# Patient Record
Sex: Female | Born: 1950 | Race: White | Hispanic: No | Smoking: Never smoker
Health system: Southern US, Community
[De-identification: ages and names within clinical notes are randomized; demographics above are authoritative.]

---

## 2007-01-31 LAB — CONVERTED CEMR LAB

## 2008-01-09 ENCOUNTER — Ambulatory Visit: Payer: Self-pay | Admitting: Family Medicine

## 2008-01-09 DIAGNOSIS — R5383 Other fatigue: Secondary | ICD-10-CM

## 2008-01-09 DIAGNOSIS — R5381 Other malaise: Secondary | ICD-10-CM

## 2008-01-09 DIAGNOSIS — F330 Major depressive disorder, recurrent, mild: Secondary | ICD-10-CM | POA: Insufficient documentation

## 2008-01-09 DIAGNOSIS — F329 Major depressive disorder, single episode, unspecified: Secondary | ICD-10-CM

## 2008-01-11 ENCOUNTER — Telehealth: Payer: Self-pay | Admitting: Family Medicine

## 2008-07-12 ENCOUNTER — Ambulatory Visit: Payer: Self-pay | Admitting: Family Medicine

## 2008-07-12 DIAGNOSIS — R109 Unspecified abdominal pain: Secondary | ICD-10-CM | POA: Insufficient documentation

## 2008-07-15 LAB — CONVERTED CEMR LAB
ALT: 14 units/L (ref 0–35)
AST: 15 units/L (ref 0–37)
Albumin: 4.5 g/dL (ref 3.5–5.2)
Alkaline Phosphatase: 59 units/L (ref 39–117)
CO2: 26 meq/L (ref 19–32)
Calcium: 9.6 mg/dL (ref 8.4–10.5)
Cholesterol: 205 mg/dL — ABNORMAL HIGH (ref 0–200)
Creatinine, Ser: 1.05 mg/dL (ref 0.40–1.20)
HCT: 43 % (ref 36.0–46.0)
Hemoglobin: 14.8 g/dL (ref 12.0–15.0)
MCV: 90.7 fL (ref 78.0–100.0)
Platelets: 267 10*3/uL (ref 150–400)
Potassium: 4.8 meq/L (ref 3.5–5.3)
RDW: 12.6 % (ref 11.5–15.5)
VLDL: 9 mg/dL (ref 0–40)

## 2008-08-02 ENCOUNTER — Ambulatory Visit: Payer: Self-pay | Admitting: Family Medicine

## 2008-08-02 DIAGNOSIS — R030 Elevated blood-pressure reading, without diagnosis of hypertension: Secondary | ICD-10-CM | POA: Insufficient documentation

## 2008-08-08 ENCOUNTER — Telehealth: Payer: Self-pay | Admitting: Family Medicine

## 2008-08-21 ENCOUNTER — Encounter: Payer: Self-pay | Admitting: Family Medicine

## 2008-09-17 ENCOUNTER — Encounter: Payer: Self-pay | Admitting: Family Medicine

## 2008-09-17 ENCOUNTER — Telehealth: Payer: Self-pay | Admitting: Family Medicine

## 2008-09-20 ENCOUNTER — Telehealth: Payer: Self-pay | Admitting: Family Medicine

## 2008-09-23 ENCOUNTER — Telehealth: Payer: Self-pay | Admitting: Family Medicine

## 2008-09-23 ENCOUNTER — Encounter: Admission: RE | Admit: 2008-09-23 | Discharge: 2008-09-23 | Payer: Self-pay | Admitting: Family Medicine

## 2008-09-23 DIAGNOSIS — N1330 Unspecified hydronephrosis: Secondary | ICD-10-CM | POA: Insufficient documentation

## 2008-09-24 ENCOUNTER — Encounter: Payer: Self-pay | Admitting: Family Medicine

## 2008-09-25 ENCOUNTER — Encounter: Payer: Self-pay | Admitting: Family Medicine

## 2008-10-15 ENCOUNTER — Encounter: Payer: Self-pay | Admitting: Family Medicine

## 2008-10-22 ENCOUNTER — Ambulatory Visit (HOSPITAL_COMMUNITY): Admission: RE | Admit: 2008-10-22 | Discharge: 2008-10-22 | Payer: Self-pay | Admitting: Urology

## 2008-10-31 ENCOUNTER — Inpatient Hospital Stay (HOSPITAL_COMMUNITY): Admission: RE | Admit: 2008-10-31 | Discharge: 2008-11-02 | Payer: Self-pay | Admitting: Urology

## 2008-10-31 ENCOUNTER — Encounter (INDEPENDENT_AMBULATORY_CARE_PROVIDER_SITE_OTHER): Payer: Self-pay | Admitting: Urology

## 2008-11-22 ENCOUNTER — Encounter: Payer: Self-pay | Admitting: Family Medicine

## 2008-12-13 ENCOUNTER — Encounter: Payer: Self-pay | Admitting: Family Medicine

## 2009-03-10 ENCOUNTER — Ambulatory Visit (HOSPITAL_COMMUNITY): Admission: RE | Admit: 2009-03-10 | Discharge: 2009-03-10 | Payer: Self-pay | Admitting: Urology

## 2009-04-06 IMAGING — NM NM RENAL IMAGING FLOW W/ PHARM
2 series · 12 of 12 positions shown · non-contrast
Comparison: CT abdomen pelvis 09/23/2008

CLINICAL DATA: Left UPJ obstruction

NUCLEAR MEDICINE RENAL SCINTIANGIOGRAPHY WITH FLOW AND FUNCTION AND
PHARMACOLOGIC AUGMENTATION
TECHNIQUE: Radionuclide angiographic and sequential renal images
were obtained after intravenous injection of radiopharmaceutical.
Imaging was continued during slow intravenous injection of Lasix
approximately 20-30 minutes after the start of the examination.
Radiopharmaceutical: 15.5 mCi technetium MAG III

[Series 1: re renal qualitative · 9.51mm/px · 6 of 130 frames shown (1 of 2)]
[frame 11/130]
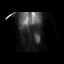
[frame 33/130]
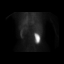
[frame 55/130]
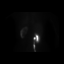
[frame 76/130]
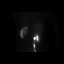
[frame 98/130]
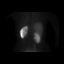
[frame 120/130]
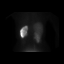

[Series 1: re renal qualitative · 9.51mm/px · 6 of 130 frames shown (2 of 2)]
[frame 11/130]
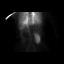
[frame 33/130]
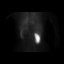
[frame 55/130]
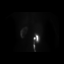
[frame 76/130]
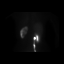
[frame 98/130]
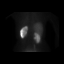
[frame 120/130]
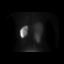

[12 of 12 positions shown; findings below may reference images not displayed]

FINDINGS: Flow:  There is marked delayed flow to the left kidney.  The right
kidney demonstrates normal perfusion.

Renogram:

Left kidney:  There is delayed cortical uptake in the left kidney
which is thinned.  There is photopenia within the left renal
collecting system suggesting hydronephrosis.  There is trace
excretion into the left renal collecting system.  No clearance of
counts from the left renal collecting system before or after Lasix
administration.

Right Kidney:  There is prompt cortical uptake within the right
kidney which is reniform shape.  There is prompt excretion of
counts into the collecting system and prompt clearance of counts
from the collecting system prior to administration of Lasix.

Differential:

Left kidney:  18%

Right Kidney:  82%

T1/2 post Lasix:

Left kidney = 787 minutes minutes
Right kidney = counts clear prior to Lasix
IMPRESSION: 1.  Left hydronephrosis and high grade obstruction at the level of
the ureteral pelvic junction.
2.  Differential renal function as above.
3.  Normal right kidney.

## 2009-04-25 ENCOUNTER — Encounter: Payer: Self-pay | Admitting: Family Medicine

## 2009-07-11 ENCOUNTER — Telehealth: Payer: Self-pay | Admitting: Family Medicine

## 2011-02-15 LAB — HEMOGLOBIN AND HEMATOCRIT, BLOOD
HCT: 32.4 % — ABNORMAL LOW (ref 36.0–46.0)
Hemoglobin: 11.3 g/dL — ABNORMAL LOW (ref 12.0–15.0)

## 2011-02-15 LAB — BASIC METABOLIC PANEL
BUN: 14 mg/dL (ref 6–23)
BUN: 9 mg/dL (ref 6–23)
CO2: 29 mEq/L (ref 19–32)
Calcium: 8.2 mg/dL — ABNORMAL LOW (ref 8.4–10.5)
Chloride: 106 mEq/L (ref 96–112)
GFR calc Af Amer: >60 mL/min (ref >60)
GFR calc non Af Amer: 55 mL/min — ABNORMAL LOW (ref >60)
Glucose, Bld: 158 mg/dL — ABNORMAL HIGH (ref 70–99)
Potassium: 4.7 mEq/L (ref 3.5–5.1)
Sodium: 137 mEq/L (ref 135–145)

## 2011-02-15 LAB — CREATININE, FLUID (PLEURAL, PERITONEAL, JP DRAINAGE): Creat, Fluid: 1.1 mg/dL

## 2011-03-16 NOTE — Op Note (Signed)
[name], [name]               ACCOUNT NO.:  [id number]   MEDICAL RECORD NO.:  [id number]          PATIENT TYPE:  INP   LOCATION:  0098                         FACILITY:  [facility]   PHYSICIAN:  Heloise Purpura, MD      DATE OF BIRTH:  January 02, 1951   DATE OF PROCEDURE:  10/31/2008  DATE OF DISCHARGE:                               OPERATIVE REPORT   PREOPERATIVE DIAGNOSIS:  Left ureteropelvic junction obstruction.   POSTOPERATIVE DIAGNOSIS:  Left ureteropelvic junction obstruction.   PROCEDURES:  1. Cystoscopy.  2. Left retrograde pyelography.  3. Left ureteral stent placement (8 x 26).  4. Left robotic-assisted laparoscopic dismembered pyeloplasty.   SURGEON:  Dr. Heloise Purpura.   ASSISTANT:  Delia Chimes, nurse practitioner.   ANESTHESIA:  General.   COMPLICATIONS:  None.   ESTIMATED BLOOD LOSS:  25 mL.   SPECIMENS:  Left ureteropelvic junction.   DISPOSITION:  Specimen to pathology.   DRAINS:  1. A 16-French Foley catheter.  2. A #15 Blake perinephric drain.   INDICATION:  [name] is a 60 year old female who presented with left-  sided flank pain.  She underwent a CT scan without contrast which  demonstrated a dilated left renal pelvis with moderate hydronephrosis  without ureteral dilation and no evidence for urolithiasis.  She  subsequently underwent further evaluation with a nuclear medicine renal  scan which indicated significant obstruction of the left kidney along  with decreased relative renal function.  After discussion regarding  management options for treatment, she did elect to proceed with the  above procedures.  She also underwent a contrasted CT scan and did not  appear to have any obvious findings suggestive of malignancy.  The  potential risks, complications, and alternative treatment options were  discussed in detail with the patient, and informed consent was obtained.   DESCRIPTION OF PROCEDURE:  The patient was taken to the operating room,  and a general anesthetic was administered.  She was given preoperative  antibiotics, placed in the dorsal lithotomy position, and prepped and  draped in the usual sterile fashion.  Next, a preoperative time out was  performed.  Cystourethroscopy was then performed which demonstrated a  normal urethra.  Inspection of the bladder revealed the ureteral  orifices to be in the normal anatomic position, and there were no  bladder tumors, stones, or other mucosal pathology that were identified.  The left ureteral orifice was identified and was attempted to be  intubated with a 6-French ureteral catheter.  It did appear to be  somewhat stenotic, and therefore, a 0.038 sensor guidewire was used to  intubate the left ureter, and the 6-French catheter was passed over the  ureter.  Contrast was injected which demonstrated a normal caliber  ureter without evidence of filling defects.  There was noted to be  significant tortuosity at the level of the ureteropelvic junction along  with hydronephrosis of the left renal pelvis.  No filling defects or  abnormalities suggestive of malignancy were identified.  At this point,  the 6-French ureteral catheter was removed after the 0.038 sensor  guidewire was advanced  back into the ureter, and this was advanced up  into the renal pelvis under fluoroscopic guidance without difficulty.  An 8 x 26-French double-J ureteral stent was then attempted to be  advanced over the wire into the ureter.  However, the distal ureter  appeared to be again stenotic and would not allow the stent to pass.  Therefore, the 4-cm balloon dilator was advanced over the wire, and the  distal left ureter was balloon dilated, again under fluoroscopic  guidance.  After the balloon had been left inflated for approximately 2  minutes, it was deflated and removed, and another attempt to place the 8  x 26 double-J ureteral stent was made.  The stent was placed without  difficulty with a good curl  noted in the renal pelvis as well as in the  bladder under fluoroscopic and cystoscopic guidance.  A 16-French Foley  catheter was then placed, and the patient was repositioned in the left  modified flank position with care to pad all potential pressure points.  Her abdomen was prepped and draped in the usual sterile fashion, and a  site was selected just superior to the umbilicus for placement of the  camera port.  This was placed using a standard open Hassan technique  which allowed entry into the peritoneal cavity under direct vision  without difficulty.  Additional 8-mm robotic ports were then placed in  the left upper quadrant, left lower quadrant, and far left lower  quadrant.  An additional 12-mm port was placed in the lower midline.  All ports were placed under direct vision without difficulty.  The  surgical cart was then docked.  With the aid of the cautery scissors,  the white line of Toldt was incised along the length of the descending  colon allowing the colon to be mobilized medially in the space between  the mesocolon and Gerota's fascia to be developed.  The ureter was  identified overlying the psoas muscle and was lifted anteriorly off the  psoas muscle, thereby allowing the posterior plane between the kidney  and the psoas muscle to be developed.  The ureter was isolated with care  to preserve the periureteral blood supply.  Dissection proceeded  superiorly, and there were noted to be crossing vessels to the lower  pole of the kidney at the level of the ureteropelvic junction.  The  renal pelvis was then isolated from the surrounding structures with a  combination of blunt and sharp dissection as necessary.  Once the renal  pelvis and ureteropelvic junction had been dissected adequately, the  ureter was divided just below the level of the ureteropelvic junction.  The patient's indwelling stent was identified and was pulled out of the  renal pelvis.  The ureter had been  partially spatulated prior to  complete dismemberment in order to mark the lateral aspect of the  ureter.  The ureteropelvic junction was then excised and sent for  permanent pathologic analysis.  The renal pelvis was then brought  anterior to the crossing vessels and examined.  The digit appeared to be  somewhat patulous, and it was opened medially.  Excess renal pelvis was  then excised off the medial aspect both anteriorly and posteriorly to  allow a nice tunnel and funnel appearance of the ureteropelvic junction  upon reconstruction.  The ureter was then spatulated further on the  lateral aspect of the ureter.  Four-0 Vicryl interrupted sutures were  then used to reapproximate the spatulated end of the ureter and  the most  inferior aspect on the lateral portion of the renal pelvis.  This  resulted in a tension-free reapproximation of the ureter and renal  pelvis.  The excised portion of the renal pelvis was then closed with a  running 4-0 Vicryl suture, leaving enough of an opening in the renal  pelvis to allow the spatulated ureter to be reapproximated to the renal  pelvis.  A 4-0 Vicryl interrupted suture was then used to suture the  medial aspect of the ureter to the appropriate portion of the renal  pelvis.  The anterior and posterior anastomoses were then performed with  a running 4-0 Vicryl sutures on either side.  The stent had been  repositioned into the renal pelvis prior to final closure of the  anterior anastomosis.  At this point, the anastomosis appeared to be  watertight and under no tension.  A #15 Blake perinephric drain was  brought in through the left lowest port site and positioned in the  perinephric space.  It was secured to the skin with a nylon suture.  All  remaining ports were then removed under direct vision.  Both 12-mm port  sites were closed with 0 Vicryl sutures, placed with the aid of a suture  passer device.  The pneumoperitoneum was expelled prior to  closure, and  inspection revealed excellent hemostasis at the end of the  procedure.  The port sites were then injected with 0.25% Marcaine and  reapproximated at the skin level with 4-0 Monocryl subcuticular  closures.  Dermabond was applied to the skin.  The patient appeared to  tolerate the procedure well without complications.  She was able to be  extubated and transferred to recovery unit in satisfactory condition.      Heloise Purpura, MD  Electronically Signed     LB/MEDQ  D:  10/31/2008  T:  10/31/2008  Job:  [id number]

## 2011-03-16 NOTE — Discharge Summary (Signed)
[name], [name]               ACCOUNT NO.:  [id number]   MEDICAL RECORD NO.:  [id number]          PATIENT TYPE:  INP   LOCATION:  1405                         FACILITY:  [facility]   PHYSICIAN:  Heloise Purpura, MD      DATE OF BIRTH:  1951/08/16   DATE OF ADMISSION:  10/31/2008  DATE OF DISCHARGE:  11/02/2008                               DISCHARGE SUMMARY   ADMISSION DIAGNOSIS:  Left ureteropelvic junction obstruction.   DISCHARGE DIAGNOSIS:  Left ureteropelvic junction obstruction.   PROCEDURE:  1. Cystoscopy.  2. Left retrograde pyelography.  3. Left ureteral stent placement.  4. Left robotic assisted laparoscopic dismembered pyeloplasty.   HISTORY AND PHYSICAL:  For full details please see admission history and  physical.  Briefly, [name] is a 60 year old female who presented  with left-sided flank pain.  She was subsequently found on imaging  studies to have left-sided hydronephrosis consistent with a left  ureteropelvic junction obstruction which was confirmed on nuclear  medicine renography.  After discussing management options for treatment,  she elected to proceed with surgical therapy and the above procedures.   HOSPITAL COURSE:  On October 31, 2008, the patient was taken to the  operating room and underwent the above procedures.  She tolerated these  procedures well without complications.  Postoperatively, she was able to  be transferred to a regular hospital room following recovery from  anesthesia.  She was able to begin ambulating that evening and remained  hemodynamically stable.  On postoperative day #1, her renal function  remained stable with a serum creatinine of 1.02.  However, she was noted  to be somewhat hyperkalemic with potassium of 5.4.  In addition, her  hematocrit remained stable at 32.4.  She had her intravenous fluids  including intravenous potassium was discontinued.  She had her diet  advanced which he tolerated without difficulty and was able  to continue  ambulating without difficulty and with minimal pain.  On the morning of  postoperative day #2, her potassium level was found to have decreased to  4.7.  Her perinephric drain fluid was sent for creatinine level and  found to be 1.1 consistent with serum.  Therefore, her drain was  removed.  She was, therefore, felt to be stable for discharge.   DISPOSITION:  Home.   DISCHARGE INSTRUCTIONS:  She is instructed to be ambulatory but  specifically told to refrain from any heavy lifting, strenuous activity,  or driving.  She was instructed to resume her regular diet.   DISCHARGE MEDICATIONS:  She was instructed to resume her lisinopril at  her normal home dose.  In addition, she was given a prescription to take  Vicodin as needed for pain and to use Colace as a stool softener.   FOLLOW UP:  She will follow up in the next couple weeks for further  postoperative evaluation.      Heloise Purpura, MD  Electronically Signed     LB/MEDQ  D:  11/02/2008  T:  11/02/2008  Job:  [id number]

## 2011-08-06 LAB — TYPE AND SCREEN: Antibody Screen: NEGATIVE

## 2011-08-06 LAB — BASIC METABOLIC PANEL
BUN: 16 mg/dL (ref 6–23)
BUN: 29 mg/dL — ABNORMAL HIGH (ref 6–23)
CO2: 24 mEq/L (ref 19–32)
CO2: 29 mEq/L (ref 19–32)
Chloride: 105 mEq/L (ref 96–112)
Creatinine, Ser: 0.97 mg/dL (ref 0.4–1.2)
Creatinine, Ser: 1.22 mg/dL — ABNORMAL HIGH (ref 0.4–1.2)
GFR calc Af Amer: >60 mL/min (ref >60)
GFR calc non Af Amer: 45 mL/min — ABNORMAL LOW (ref >60)
Potassium: 4.1 mEq/L (ref 3.5–5.1)
Potassium: 5.1 mEq/L (ref 3.5–5.1)

## 2011-08-06 LAB — CBC
HCT: 39.1 % (ref 36.0–46.0)
MCV: 92.1 fL (ref 78.0–100.0)
Platelets: 310 10*3/uL (ref 150–400)

## 2011-08-06 LAB — ABO/RH: ABO/RH(D): A POS

## 2011-08-06 LAB — HEMOGLOBIN AND HEMATOCRIT, BLOOD: HCT: 35.6 % — ABNORMAL LOW (ref 36.0–46.0)

## 2015-05-02 ENCOUNTER — Ambulatory Visit (INDEPENDENT_AMBULATORY_CARE_PROVIDER_SITE_OTHER): Payer: 59

## 2015-05-02 ENCOUNTER — Other Ambulatory Visit: Payer: Self-pay | Admitting: Family Medicine

## 2015-05-02 DIAGNOSIS — S92311A Displaced fracture of first metatarsal bone, right foot, initial encounter for closed fracture: Secondary | ICD-10-CM | POA: Diagnosis not present

## 2015-05-02 DIAGNOSIS — X58XXXA Exposure to other specified factors, initial encounter: Secondary | ICD-10-CM | POA: Diagnosis not present

## 2015-05-02 DIAGNOSIS — T1490XA Injury, unspecified, initial encounter: Secondary | ICD-10-CM

## 2015-07-14 LAB — HM PAP SMEAR: HM Pap smear: NEGATIVE

## 2015-07-14 LAB — RESULTS CONSOLE HPV: CHL HPV: NEGATIVE

## 2016-02-23 LAB — HM DEXA SCAN

## 2016-07-20 LAB — TSH: TSH: 1.62 (ref 0.41–5.90)

## 2019-12-12 HISTORY — PX: ORIF DISTAL RADIUS FRACTURE: SUR927

## 2020-10-15 LAB — HM MAMMOGRAPHY

## 2021-01-29 LAB — HEMOGLOBIN A1C: Hemoglobin A1C: 5.6

## 2022-02-17 LAB — CBC AND DIFFERENTIAL
HCT: 49 — AB (ref 36–46)
Hemoglobin: 16.9 — AB (ref 12.0–16.0)
Neutrophils Absolute: 64
Platelets: 313 10*3/uL (ref 150–400)
WBC: 5.2

## 2022-02-17 LAB — BASIC METABOLIC PANEL
BUN: 15 (ref 4–21)
CO2: 25 — AB (ref 13–22)
Chloride: 102 (ref 99–108)
Creatinine: 0.8 (ref 0.5–1.1)
Glucose: 102
Potassium: 4.6 mEq/L (ref 3.5–5.1)
Sodium: 142 (ref 137–147)

## 2022-02-17 LAB — LIPID PANEL
Cholesterol: 260 — AB (ref 0–200)
HDL: 122 — AB (ref 35–70)
LDL Cholesterol: 126
LDl/HDL Ratio: 2.1
Triglycerides: 72 (ref 40–160)

## 2022-02-17 LAB — COMPREHENSIVE METABOLIC PANEL
Albumin: 5 (ref 3.5–5.0)
Calcium: 9.5 (ref 8.7–10.7)
Globulin: 2.1
eGFR: 81

## 2022-02-17 LAB — CBC: RBC: 5.36 — AB (ref 3.87–5.11)

## 2022-02-17 LAB — HEPATIC FUNCTION PANEL
ALT: 15 U/L (ref 7–35)
AST: 21 (ref 13–35)
Alkaline Phosphatase: 68 (ref 25–125)
Bilirubin, Total: 0.7

## 2022-02-17 LAB — VITAMIN D 25 HYDROXY (VIT D DEFICIENCY, FRACTURES): Vit D, 25-Hydroxy: 51.6

## 2022-03-04 DIAGNOSIS — H52222 Regular astigmatism, left eye: Secondary | ICD-10-CM | POA: Insufficient documentation

## 2022-03-04 DIAGNOSIS — H25812 Combined forms of age-related cataract, left eye: Secondary | ICD-10-CM | POA: Insufficient documentation

## 2023-01-10 ENCOUNTER — Telehealth: Payer: Self-pay | Admitting: Family Medicine

## 2023-01-10 NOTE — Telephone Encounter (Signed)
Pt called stating she was a pt in the past and would like to reestablish care due to her current PCP retiring. Can I schedule her as a new pt with you?

## 2023-01-11 NOTE — Telephone Encounter (Signed)
Ok to schedule.

## 2023-01-11 NOTE — Telephone Encounter (Signed)
Left a voicemail for pt to call back and schedule an appointment.

## 2023-01-27 ENCOUNTER — Encounter: Payer: Self-pay | Admitting: Family Medicine

## 2023-02-02 ENCOUNTER — Ambulatory Visit (INDEPENDENT_AMBULATORY_CARE_PROVIDER_SITE_OTHER): Payer: 59 | Admitting: Family Medicine

## 2023-02-02 ENCOUNTER — Encounter: Payer: Self-pay | Admitting: Family Medicine

## 2023-02-02 VITALS — BP 170/80 | HR 81 | Ht 67.0 in | Wt 128.0 lb

## 2023-02-02 DIAGNOSIS — F330 Major depressive disorder, recurrent, mild: Secondary | ICD-10-CM

## 2023-02-02 DIAGNOSIS — R030 Elevated blood-pressure reading, without diagnosis of hypertension: Secondary | ICD-10-CM | POA: Diagnosis not present

## 2023-02-02 DIAGNOSIS — Z789 Other specified health status: Secondary | ICD-10-CM

## 2023-02-02 DIAGNOSIS — E785 Hyperlipidemia, unspecified: Secondary | ICD-10-CM | POA: Diagnosis not present

## 2023-02-02 LAB — CBC
MCH: 31.4 pg (ref 27.0–33.0)
MCHC: 33.8 g/dL (ref 32.0–36.0)
WBC: 4.5 10*3/uL (ref 3.8–10.8)

## 2023-02-02 MED ORDER — ESCITALOPRAM OXALATE 10 MG PO TABS: 10.0000 mg | ORAL_TABLET | Freq: Every day | ORAL | 3 refills | Status: DC

## 2023-02-02 NOTE — Assessment & Plan Note (Signed)
BP high today. Plan to recheck with nurse visit in 2-3 weeks.

## 2023-02-02 NOTE — Progress Notes (Addendum)
New Patient Office Visit  Subjective    Patient ID: [name], female    DOB: April 16, 1951  Age: 72 y.o. MRN: [id number]  CC:  Chief Complaint  Patient presents with   Establish Care    HPI [name] presents to establish care Transfers care from her retired physician Harl Bowie. Brought in old paper chart.   She has been on Lexapro for about 2 years for mild depression. She works full time. She has been getting Rx from her gyn Dr. Patterson Hammersmith recently.  Does need refills.    Also reports drinking wine, several glasses at night. Had quit for about 3 months and then sister came to visit and started drinking alcohol again. Never misses work.  Doesn't feel more socially withdrawn as doesn't leave house at night.  Works form home   Outpatient Encounter Medications as of 02/02/2023  Medication Sig   escitalopram (LEXAPRO) 10 MG tablet Take 1 tablet (10 mg total) by mouth daily.   [DISCONTINUED] escitalopram (LEXAPRO) 10 MG tablet Take 10 mg by mouth daily.   No facility-administered encounter medications on file as of 02/02/2023.    History reviewed. No pertinent past medical history.  Past Surgical History:  Procedure Laterality Date   ORIF DISTAL RADIUS FRACTURE Right 12/12/2019    Family History  Problem Relation Age of Onset   Diabetes Mother    Stroke Mother    Hypertension Mother    Pancreatic cancer Mother    Heart attack Maternal Grandfather    Stroke Paternal Grandmother     Social History   Socioeconomic History   Marital status: Divorced    Spouse name: Not on file   Number of children: 1   Years of education: Not on file   Highest education level: Not on file  Occupational History   Occupation: Sr. Advertising account executive  Tobacco Use   Smoking status: Never    Passive exposure: Never   Smokeless tobacco: Never  Vaping Use   Vaping Use: Never used  Substance and Sexual Activity   Alcohol use: Yes    Alcohol/week: 7.0 standard drinks of alcohol     Types: 7 Glasses of wine per week   Drug use: Never   Sexual activity: Not Currently  Other Topics Concern   Not on file  Social History Narrative   Not on file   Social Determinants of Health   Financial Resource Strain: Low Risk  (02/02/2023)   Overall Financial Resource Strain (CARDIA)    Difficulty of Paying Living Expenses: Not hard at all  Food Insecurity: No Food Insecurity (02/02/2023)   Hunger Vital Sign    Worried About Running Out of Food in the Last Year: Never true    Ran Out of Food in the Last Year: Never true  Transportation Needs: No Transportation Needs (02/02/2023)   PRAPARE - Administrator, Civil Service (Medical): No    Lack of Transportation (Non-Medical): No  Physical Activity: Not on file  Stress: No Stress Concern Present (02/02/2023)   [employer] of Occupational Health - Occupational Stress Questionnaire    Feeling of Stress : Not at all  Social Connections: Not on file  Intimate Partner Violence: Not At Risk (02/02/2023)   Humiliation, Afraid, Rape, and Kick questionnaire    Fear of Current or Ex-Partner: No    Emotionally Abused: No    Physically Abused: No    Sexually Abused: No    Review of Systems  HENT:  Positive for hearing loss.   Musculoskeletal:  Positive for back pain.       + recent injury  Skin:        Hair/nail problem  Psychiatric/Behavioral:  Positive for depression.        Alcohol overuse  All other systems reviewed and are negative.       Objective    BP (!) 170/80   Pulse 81   Ht  (1.702 m)   Wt 128 lb (58.1 kg)   SpO2 98%   BMI 20.05 kg/m   Physical Exam Vitals and nursing note reviewed.  Constitutional:      Appearance: She is well-developed.  HENT:     Head: Normocephalic and atraumatic.  Cardiovascular:     Rate and Rhythm: Normal rate and regular rhythm.     Heart sounds: Normal heart sounds.  Pulmonary:     Effort: Pulmonary effort is normal.     Breath sounds: Normal breath sounds.   Skin:    General: Skin is warm and dry.  Neurological:     Mental Status: She is alert and oriented to person, place, and time.  Psychiatric:        Behavior: Behavior normal.         Assessment & Plan:   Problem List Items Addressed This Visit       Other   MDD (major depressive disorder), recurrent episode, mild    Refill meds.  F/U in 6 months. Discussed how alcohol affects the efficacy of the Lexapro and topping could make a difference.       Relevant Medications   escitalopram (LEXAPRO) 10 MG tablet   Other Relevant Orders   Lipid Panel w/reflex Direct LDL (Completed)   COMPLETE METABOLIC PANEL WITH GFR (Completed)   CBC (Completed)   TSH (Completed)   Hyperlipidemia LDL goal <130 - Primary   Relevant Orders   Lipid Panel w/reflex Direct LDL (Completed)   COMPLETE METABOLIC PANEL WITH GFR (Completed)   CBC (Completed)   TSH (Completed)   ELEVATED BP READING WITHOUT DX HYPERTENSION    BP high today. Plan to recheck with nurse visit in 2-3 weeks.       Alcohol use    Discussed options. She will consider EAP through work.  I fully support her efforts.       Return in about 6 months (around 08/04/2023) for Mood.   Nani Gasser, MD

## 2023-02-02 NOTE — Assessment & Plan Note (Signed)
Discussed options. She will consider EAP through work.  I fully support her efforts.

## 2023-02-02 NOTE — Assessment & Plan Note (Signed)
Refill meds.  F/U in 6 months. Discussed how alcohol affects the efficacy of the Lexapro and topping could make a difference.

## 2023-02-03 LAB — LIPID PANEL W/REFLEX DIRECT LDL
Cholesterol: 253 mg/dL — ABNORMAL HIGH (ref <200)
HDL: 106 mg/dL (ref >=50)
LDL Cholesterol (Calc): 130 mg/dL (calc) — ABNORMAL HIGH
Non-HDL Cholesterol (Calc): 147 mg/dL (calc) — ABNORMAL HIGH (ref <130)
Total CHOL/HDL Ratio: 2.4 (calc) (ref <5.0)
Triglycerides: 78 mg/dL (ref <150)

## 2023-02-03 LAB — COMPLETE METABOLIC PANEL WITH GFR
AG Ratio: 2.1 (calc) (ref 1.0–2.5)
ALT: 10 U/L (ref 6–29)
AST: 15 U/L (ref 10–35)
Albumin: 4.9 g/dL (ref 3.6–5.1)
Alkaline phosphatase (APISO): 72 U/L (ref 37–153)
BUN: 18 mg/dL (ref 7–25)
CO2: 26 mmol/L (ref 20–32)
Calcium: 9.6 mg/dL (ref 8.6–10.4)
Chloride: 102 mmol/L (ref 98–110)
Creat: 0.79 mg/dL (ref 0.60–1.00)
Globulin: 2.3 g/dL (calc) (ref 1.9–3.7)
Glucose, Bld: 91 mg/dL (ref 65–99)
Potassium: 4.6 mmol/L (ref 3.5–5.3)
Sodium: 139 mmol/L (ref 135–146)
Total Bilirubin: 0.8 mg/dL (ref 0.2–1.2)
Total Protein: 7.2 g/dL (ref 6.1–8.1)
eGFR: 79 mL/min/{1.73_m2} (ref >=60)

## 2023-02-03 LAB — CBC
HCT: 47.1 % — ABNORMAL HIGH (ref 35.0–45.0)
Hemoglobin: 15.9 g/dL — ABNORMAL HIGH (ref 11.7–15.5)
MCV: 93.1 fL (ref 80.0–100.0)
MPV: 9.6 fL (ref 7.5–12.5)
Platelets: 314 10*3/uL (ref 140–400)
RBC: 5.06 10*6/uL (ref 3.80–5.10)
RDW: 12.6 % (ref 11.0–15.0)

## 2023-02-03 LAB — TSH: TSH: 3.02 mIU/L (ref 0.40–4.50)

## 2023-02-03 NOTE — Progress Notes (Addendum)
Call patient: LDL cholesterol is elevated at 130 but HDL cholesterol looks great.  Will continue to monitor.  Hemoglobin is stable at 15.9.  Little bit on the higher side.  But it looks like it is better than it was last year.  Thyroid and metabolic panel look good.

## 2023-07-27 ENCOUNTER — Other Ambulatory Visit: Payer: Self-pay | Admitting: Family Medicine

## 2023-07-27 ENCOUNTER — Ambulatory Visit (INDEPENDENT_AMBULATORY_CARE_PROVIDER_SITE_OTHER): Payer: 59

## 2023-07-27 VITALS — BP 182/80 | HR 67 | Ht 67.0 in | Wt 136.0 lb

## 2023-07-27 DIAGNOSIS — R030 Elevated blood-pressure reading, without diagnosis of hypertension: Secondary | ICD-10-CM

## 2023-07-27 MED ORDER — AMLODIPINE BESYLATE 5 MG PO TABS
5.0000 mg | ORAL_TABLET | Freq: Every day | ORAL | 0 refills | Status: DC
Start: 2023-07-27 — End: 2023-08-23

## 2023-07-27 NOTE — Progress Notes (Signed)
Established Patient Office Visit  Subjective   Patient ID: RUBELL CANTARELLA, female    DOB: [birth year]  Age: 72 y.o. MRN: [id number]  Chief Complaint  Patient presents with   Hypertension    Elevated b/p at [facility] visit b/p check    HPI  Patient is here for a blood pressure check.Denies trouble sleeping,palpitations or medication problems.  ROS    Objective:     BP (!) 187/116   Pulse 64   Ht 5\' 7"  (1.702 m)   Wt 136 lb 0.6 oz (61.7 kg)   SpO2 97%   BMI 21.31 kg/m    Physical Exam   No results found for any visits on 07/27/23.    The ASCVD Risk score (Arnett DK, et al., 2019) failed to calculate for the following reasons:   The valid HDL cholesterol range is 20 to 100 mg/dL    Assessment & Plan:  Per Dr. Ashley Royalty patient will take 5 mg of amlodipine.Patient was advise to schedule 3 week HTN follow up with PCP per Dr. Ashley Royalty. Problem List Items Addressed This Visit       Other   ELEVATED BP READING WITHOUT DX HYPERTENSION - Primary    No follow-ups on file.    Naleigha Raimondi  [place], CMA

## 2023-07-27 NOTE — Patient Instructions (Addendum)
Per Dr. Ashley Royalty patient will take 5 mg of amlodipine.Patient was advise to schedule 3 week HTN follow up with PCP per Dr. Ashley Royalty.

## 2023-08-04 ENCOUNTER — Encounter: Payer: 59 | Admitting: Family Medicine

## 2023-08-23 ENCOUNTER — Ambulatory Visit (INDEPENDENT_AMBULATORY_CARE_PROVIDER_SITE_OTHER): Payer: 59 | Admitting: Family Medicine

## 2023-08-23 ENCOUNTER — Encounter: Payer: Self-pay | Admitting: Family Medicine

## 2023-08-23 VITALS — BP 136/64 | HR 81 | Ht 67.0 in

## 2023-08-23 DIAGNOSIS — F330 Major depressive disorder, recurrent, mild: Secondary | ICD-10-CM | POA: Diagnosis not present

## 2023-08-23 DIAGNOSIS — R030 Elevated blood-pressure reading, without diagnosis of hypertension: Secondary | ICD-10-CM

## 2023-08-23 NOTE — Assessment & Plan Note (Signed)
We did discuss getting a home blood pressure cuff and recording some blood pressures at home.  She does have a follow-up in a couple weeks would be perfect she can bring in her home cuff as well as those readings.  We can see if overall her blood pressure looks better.  Her blood pressure did read much better with a manual BP today then with our automated machine.  So I did encourage her to asked staff when she comes in for them to do a manual BP on her.  Encouraged her to continue to work on healthy diet and regular exercise.  We also discussed the DASH diet.  I meant to give her handout while she was here so we will mail that to her.

## 2023-08-23 NOTE — Patient Instructions (Signed)
Blood pressure machine that are reliable include [employer] and [employer].

## 2023-08-23 NOTE — Progress Notes (Signed)
   Established Patient Office Visit  Subjective   Patient ID: [name], female    DOB: August 05, 1951  Age: 72 y.o. MRN: [id number]  Chief Complaint  Patient presents with   Hypertension    HPI  HTN - she never started the amlodipine.  She says when she originally went to Dr. Patterson Hammersmith her OB/GYN where her blood pressure systolic was greater than 200 and then referred to our office the same day she was under an enormous amount of stress at work.  Those things have passed and she is actually feeling much better.  She never actually started the amlodipine.  She also decided to stop her Lexapro about 10 days ago just wanting to make sure that it was not causing any of the blood pressure issues.  She says she is actually felt a little bit better off of it.  She had been on it for about the last 2 years for depression and anxiety.  He does still drink alcohol daily particularly wine.    ROS    Objective:     BP 136/64   Pulse 81   Ht 5\' 7"  (1.702 m)   SpO2 99%   BMI 21.31 kg/m    Physical Exam Vitals and nursing note reviewed.  Constitutional:      Appearance: Normal appearance.  HENT:     Head: Normocephalic and atraumatic.  Eyes:     Conjunctiva/sclera: Conjunctivae normal.  Cardiovascular:     Rate and Rhythm: Normal rate and regular rhythm.  Pulmonary:     Effort: Pulmonary effort is normal.     Breath sounds: Normal breath sounds.  Skin:    General: Skin is warm and dry.  Neurological:     Mental Status: She is alert.  Psychiatric:        Mood and Affect: Mood normal.     No results found for any visits on 08/23/23.    The ASCVD Risk score (Arnett DK, et al., 2019) failed to calculate for the following reasons:   The valid HDL cholesterol range is 20 to 100 mg/dL    Assessment & Plan:   Problem List Items Addressed This Visit       Other   MDD (major depressive disorder), recurrent episode, mild (HCC)    If she decided would like to restart mood  medication we can always try something different than the lexapro. Also controlling stress and anxiety is really important for controlling her BP.        ELEVATED BP READING WITHOUT DX HYPERTENSION - Primary    We did discuss getting a home blood pressure cuff and recording some blood pressures at home.  She does have a follow-up in a couple weeks would be perfect she can bring in her home cuff as well as those readings.  We can see if overall her blood pressure looks better.  Her blood pressure did read much better with a manual BP today then with our automated machine.  So I did encourage her to asked staff when she comes in for them to do a manual BP on her.  Encouraged her to continue to work on healthy diet and regular exercise.  We also discussed the DASH diet.  I meant to give her handout while she was here so we will mail that to her.       Return in about 6 months (around 02/21/2024).    Nani Gasser, MD

## 2023-08-23 NOTE — Assessment & Plan Note (Signed)
If she decided would like to restart mood medication we can always try something different than the lexapro. Also controlling stress and anxiety is really important for controlling her BP.

## 2023-09-02 ENCOUNTER — Encounter: Payer: 59 | Admitting: Family Medicine

## 2023-10-10 ENCOUNTER — Encounter: Payer: Self-pay | Admitting: Family Medicine

## 2023-10-10 ENCOUNTER — Ambulatory Visit (INDEPENDENT_AMBULATORY_CARE_PROVIDER_SITE_OTHER): Payer: 59 | Admitting: Family Medicine

## 2023-10-10 VITALS — BP 132/70 | HR 77 | Ht 67.0 in | Wt 135.0 lb

## 2023-10-10 DIAGNOSIS — Z789 Other specified health status: Secondary | ICD-10-CM

## 2023-10-10 DIAGNOSIS — F330 Major depressive disorder, recurrent, mild: Secondary | ICD-10-CM

## 2023-10-10 DIAGNOSIS — Z0000 Encounter for general adult medical examination without abnormal findings: Secondary | ICD-10-CM

## 2023-10-10 MED ORDER — FLUOXETINE HCL 20 MG PO TABS: ORAL_TABLET | ORAL | 1 refills | Status: DC

## 2023-10-10 NOTE — Assessment & Plan Note (Signed)
He is really working on cutting back on her alcohol intake.  And also has partnered with a friend who is also worked through program to quit and that is been helpful as well.

## 2023-10-10 NOTE — Addendum Note (Signed)
Addended by: Nani Gasser D on: 10/10/2023 04:05 PM   Modules accepted: Orders

## 2023-10-10 NOTE — Assessment & Plan Note (Addendum)
  PHQ-9 score of 13 today we did discuss some options.  The only medication that she is taken was Lexapro and really felt like it was not very effective.  She is open to trying something different just something weight neutral.  She reports symptoms primarily of feeling down depressed and hopeless as well as little interest and pleasure doing things.  Also difficulty with sleep and low energy and feeling bad about herself.  Will do a trial with fluoxetine.  20 mg.  Will start with half a tab for 10 days and then go to a whole tab daily.  Plan to follow-up in about 4 to 5 weeks to make sure tolerating well and make any adjustments needed.  Also could consider Cymbalta if not effective  Flowsheet Row Office Visit from 02/02/2023 in [facility] at [facility]  PHQ-9 Total Score 13         02/02/2023   11:39 AM  GAD 7 : Generalized Anxiety Score  Nervous, Anxious, on Edge 0  Control/stop worrying 1  Worry too much - different things 1  Trouble relaxing 0  Restless 0  Easily annoyed or irritable 0  Afraid - awful might happen 0  Total GAD 7 Score 2  Anxiety Difficulty Somewhat difficult

## 2023-10-10 NOTE — Progress Notes (Addendum)
Complete physical exam  Patient: [name]   DOB: [birth year]   72 y.o. Female  MRN: [id number]  Subjective:    Chief Complaint  Patient presents with   Annual Exam    [name] is a 72 y.o. female who presents today for a complete physical exam. She reports consuming a general diet. The patient does not participate in regular exercise at present. She generally feels ok, but doesn't want to do things, or get our ot the house.  .she has really tried to cut back on alcohol intake.   She does not have additional problems to discuss today.   Wanted to let me know that she actually fell on her stairs back in November.  She did injure her back she ended up going to [facility] nothing was fractured she says she is just about 100% back but just wanted to let me know about it.  Does still take her MVI and vitamin D.   Most recent fall risk assessment:    10/10/2023    3:54 PM  Fall Risk   Falls in the past year? 1  Number falls in past yr: 0  Injury with Fall? 1  Risk for fall due to : History of fall(s)  Follow up Falls evaluation completed     Most recent depression screenings:    10/10/2023    3:55 PM 02/02/2023   11:39 AM  PHQ 2/9 Scores  PHQ - 2 Score 5 5  PHQ- 9 Score 12 13        Patient Care Team: Agapito Games, MD as PCP - General (Family Medicine) Cain Saupe, MD as Referring Physician (Ophthalmology) Julaine Hua, MD as Referring Physician (Obstetrics and Gynecology)   Outpatient Medications Prior to Visit  Medication Sig   cholecalciferol (VITAMIN D3) 25 MCG (1000 UNIT) tablet Take 1,000 Units by mouth daily.   Multiple Vitamin (MULTIVITAMIN) tablet Take 1 tablet by mouth daily.   No facility-administered medications prior to visit.    ROS        Objective:     BP 132/70   Pulse 77   Ht 5\' 7"  (1.702 m)   Wt 135 lb (61.2 kg)   SpO2 98%   BMI 21.14 kg/m    Physical Exam Vitals and nursing note reviewed.   Constitutional:      Appearance: Normal appearance.  HENT:     Head: Normocephalic and atraumatic.  Eyes:     Conjunctiva/sclera: Conjunctivae normal.  Cardiovascular:     Rate and Rhythm: Normal rate and regular rhythm.  Pulmonary:     Effort: Pulmonary effort is normal.     Breath sounds: Normal breath sounds.  Skin:    General: Skin is warm and dry.  Neurological:     Mental Status: She is alert.  Psychiatric:        Mood and Affect: Mood normal.      No results found for any visits on 10/10/23.     Assessment & Plan:    Routine Health Maintenance and Physical Exam  Immunization History  Administered Date(s) Administered   Td 11/01/2002    Health Maintenance  Topic Date Due   INFLUENZA VACCINE  01/30/2024 (Originally 06/02/2023)   Pneumonia Vaccine 59+ Years old (1 of 1 - PCV) 02/02/2024 (Originally 07/28/2014)   MAMMOGRAM  02/02/2024 (Originally 10/15/2022)   Colonoscopy  02/02/2024 (Originally 07/28/1994)   Hepatitis C Screening  02/02/2024 (Originally 07/29/1967)   Zoster Vaccines- Shingrix (1  of 2) 05/03/2024 (Originally 07/29/1999)   DTaP/Tdap/Td (2 - Tdap) 10/09/2024 (Originally 11/01/2012)   COVID-19 Vaccine (1 - 2023-24 season) 10/25/2024 (Originally 07/03/2023)   DEXA SCAN  Completed   HPV VACCINES  Aged Out    Discussed health benefits of physical activity, and encouraged her to engage in regular exercise appropriate for her age and condition.  Problem List Items Addressed This Visit       Other   MDD (major depressive disorder), recurrent episode, mild (HCC)     PHQ-9 score of 13 today we did discuss some options.  The only medication that she is taken was Lexapro and really felt like it was not very effective.  She is open to trying something different just something weight neutral.  She reports symptoms primarily of feeling down depressed and hopeless as well as little interest and pleasure doing things.  Also difficulty with sleep and low energy and  feeling bad about herself.  Will do a trial with fluoxetine.  20 mg.  Will start with half a tab for 10 days and then go to a whole tab daily.  Plan to follow-up in about 4 to 5 weeks to make sure tolerating well and make any adjustments needed.  Also could consider Cymbalta if not effective  Flowsheet Row Office Visit from 02/02/2023 in [facility] at [facility]  PHQ-9 Total Score 13         02/02/2023   11:39 AM  GAD 7 : Generalized Anxiety Score  Nervous, Anxious, on Edge 0  Control/stop worrying 1  Worry too much - different things 1  Trouble relaxing 0  Restless 0  Easily annoyed or irritable 0  Afraid - awful might happen 0  Total GAD 7 Score 2  Anxiety Difficulty Somewhat difficult           Relevant Medications   FLUoxetine (PROZAC) 20 MG tablet   Alcohol use    He is really working on cutting back on her alcohol intake.  And also has partnered with a friend who is also worked through program to quit and that is been helpful as well.      Other Visit Diagnoses     Wellness examination    -  Primary       Keep up a regular exercise program and make sure you are eating a healthy diet Try to eat 4 servings of dairy a day, or if you are lactose intolerant take a calcium with vitamin D daily.  Your vaccines are up to date.   Return in about 4 weeks (around 11/07/2023) for New start medication.     Nani Gasser, MD

## 2023-11-10 ENCOUNTER — Encounter: Payer: Self-pay | Admitting: Family Medicine

## 2023-11-10 ENCOUNTER — Ambulatory Visit (INDEPENDENT_AMBULATORY_CARE_PROVIDER_SITE_OTHER): Payer: 59 | Admitting: Family Medicine

## 2023-11-10 VITALS — BP 136/72 | HR 78 | Ht 67.0 in | Wt 132.0 lb

## 2023-11-10 DIAGNOSIS — R030 Elevated blood-pressure reading, without diagnosis of hypertension: Secondary | ICD-10-CM

## 2023-11-10 DIAGNOSIS — F330 Major depressive disorder, recurrent, mild: Secondary | ICD-10-CM | POA: Diagnosis not present

## 2023-11-10 DIAGNOSIS — E785 Hyperlipidemia, unspecified: Secondary | ICD-10-CM

## 2023-11-10 DIAGNOSIS — Z789 Other specified health status: Secondary | ICD-10-CM

## 2023-11-10 MED ORDER — FLUOXETINE HCL 20 MG PO TABS: 20.0000 mg | ORAL_TABLET | Freq: Every day | ORAL | 1 refills | Status: DC

## 2023-11-10 NOTE — Assessment & Plan Note (Signed)
 We discussed options including going up on her medication.  Her PHQ-9 score was 12 and it is down to 6 which is great that is about a 50% reduction in depression symptoms she still has a GAD-7 score of 4.  We discussed the possibility of going up but she wants to continue with her current regimen for another month and has added a B12 complex.  Very reasonable to give this a little bit longer to reach full efficacy and at that point decide on whether or not to adjust the medication or keep it the same.  Refill sent to pharmacy today.

## 2023-11-10 NOTE — Assessment & Plan Note (Addendum)
 Great job for cutting out wine for the last 5 weeks.  Congratulated her and encouraged her to just continue to work on that.

## 2023-11-10 NOTE — Progress Notes (Addendum)
 Established Patient Office Visit  Subjective  Patient ID: [name], female    DOB: Jul 16, 1951  Age: 73 y.o. MRN: [id number]  Chief Complaint  Patient presents with   Depression    Pt reports that she feels more anxious at times.    HPI  Was here in December for her checkup and we discussed some active depressive symptoms.  We decided to start fluoxetine . She is doing well on medication.    Flowsheet Row Office Visit from 11/10/2023 in [facility] at [facility]  PHQ-9 Total Score 8      She would like to lose weight in the middle.  She eats pretty healthy she focuses mostly on keto which she has done for a long time.  But she does eat more lean proteins she is cut out bacon.  But she is not currently exercising.  She hasn't had wine in almost 5 weeks. Checked with work  and they cover E[employer]. Wants to know names of our 2 therapist here to see if in her network.   Sister with high cholesterol.  Reports cholesterol through work: TC 253 TG 95 LDL 160 Gluc 98  ROS    Objective:     BP 136/72   Pulse 78   Ht 5' 7 (1.702 m)   Wt 132 lb (59.9 kg)   SpO2 96%   BMI 20.67 kg/m    Physical Exam Vitals and nursing note reviewed.  Constitutional:      Appearance: Normal appearance.  HENT:     Head: Normocephalic and atraumatic.  Eyes:     Conjunctiva/sclera: Conjunctivae normal.  Cardiovascular:     Rate and Rhythm: Normal rate and regular rhythm.  Pulmonary:     Effort: Pulmonary effort is normal.     Breath sounds: Normal breath sounds.  Skin:    General: Skin is warm and dry.  Neurological:     Mental Status: She is alert.  Psychiatric:        Mood and Affect: Mood normal.      No results found for any visits on 11/10/23.    The ASCVD Risk score (Arnett DK, et al., 2019) failed to calculate for the following reasons:   The valid HDL cholesterol range is 20 to 100 mg/dL    Assessment & Plan:   Problem  List Items Addressed This Visit       Other   MDD (major depressive disorder), recurrent episode, mild (HCC) - Primary   We discussed options including going up on her medication.  Her PHQ-9 score was 12 and it is down to 6 which is great that is about a 50% reduction in depression symptoms she still has a GAD-7 score of 4.  We discussed the possibility of going up but she wants to continue with her current regimen for another month and has added a B12 complex.  Very reasonable to give this a little bit longer to reach full efficacy and at that point decide on whether or not to adjust the medication or keep it the same.  Refill sent to pharmacy today.      Relevant Medications   FLUoxetine  (PROZAC ) 20 MG tablet   Hyperlipidemia LDL goal <130   We discussed that sometimes elevated lipids is genetic.  But it still is considered a risk factor for heart disease.  Discussed the Mediterranean diet today.      ELEVATED BP READING WITHOUT DX HYPERTENSION  Pressure actually looks pretty good today which is great.      Alcohol use   Great job for cutting out wine for the last 5 weeks.  Congratulated her and encouraged her to just continue to work on that.       Return in about 1 month (around 12/11/2023) for Mood.    Dorothyann Byars, MD

## 2023-11-10 NOTE — Assessment & Plan Note (Signed)
 We discussed that sometimes elevated lipids is genetic.  But it still is considered a risk factor for heart disease.  Discussed the Mediterranean diet today.

## 2023-11-10 NOTE — Assessment & Plan Note (Signed)
 Pressure actually looks pretty good today which is great.

## 2023-11-10 NOTE — Patient Instructions (Signed)
 Teofilo Pod Serafina Mitchell.

## 2023-12-12 ENCOUNTER — Encounter: Payer: Self-pay | Admitting: Family Medicine

## 2023-12-12 ENCOUNTER — Ambulatory Visit (INDEPENDENT_AMBULATORY_CARE_PROVIDER_SITE_OTHER): Payer: 59 | Admitting: Family Medicine

## 2023-12-12 VITALS — BP 128/68 | HR 66 | Ht 67.0 in | Wt 133.0 lb

## 2023-12-12 DIAGNOSIS — F330 Major depressive disorder, recurrent, mild: Secondary | ICD-10-CM | POA: Diagnosis not present

## 2023-12-12 MED ORDER — FLUOXETINE HCL 40 MG PO CAPS
40.0000 mg | ORAL_CAPSULE | Freq: Every day | ORAL | 3 refills | Status: DC
Start: 2023-12-12 — End: 2024-02-10

## 2023-12-12 NOTE — Progress Notes (Addendum)
   Established Patient Office Visit  Subjective  Patient ID: [name], female    DOB: 1950-11-29  Age: 73 y.o. MRN: [id number]  Chief Complaint  Patient presents with   mood    HPI  Major depressive disorder-she is on fluoxetine .  Currently on 20 mg.  We discussed previously possibly adjusting her dose even though at that point she had already had a 50% reduction in anxiety symptoms still struggling some with lack of energy and motivation.  Still having some mild depressive symptoms as well.  She recently started supplement, B12 complex, as well as some fish oil and collagen to see if that would be helpful as well.  She is not on weeks sober from alcohol.    She did want to mention to that about a year ago she had a syncopal event where she actually hit her head on a piece of furniture.  She had a pretty bad headache the rest of that evening but woke up feeling better the next day.  She just wanted to mention it in case it could be contributing to some of her mood dysregulation.  ROS    Objective:     BP 128/68   Pulse 66   Ht 5\' 7"  (1.702 m)   Wt 133 lb (60.3 kg)   SpO2 99%   BMI 20.83 kg/m    Physical Exam Vitals and nursing note reviewed.  Constitutional:      Appearance: Normal appearance.  HENT:     Head: Normocephalic and atraumatic.  Eyes:     Conjunctiva/sclera: Conjunctivae normal.  Pulmonary:     Effort: Pulmonary effort is normal.  Skin:    General: Skin is warm and dry.  Neurological:     Mental Status: She is alert and oriented to person, place, and time.  Psychiatric:        Mood and Affect: Mood normal.        Behavior: Behavior normal.      No results found for any visits on 12/12/23.    The ASCVD Risk score (Arnett DK, et al., 2019) failed to calculate for the following reasons:   The valid HDL cholesterol range is 20 to 100 mg/dL    Assessment & Plan:   Problem List Items Addressed This Visit       Other   MDD (major depressive  disorder), recurrent episode, mild (HCC) - Primary   Discussed options.  Will go ahead and increase fluoxetine  to 40 mg.f/u in 8 weeks will see if helpful with reducing apathy and improving motivation and reducing depressive sxs.  She is still staying active and exercising.  Started working directly with EAP but did file to start.  Looking to find an external therapist who is in network for her.      Relevant Medications   FLUoxetine  (PROZAC ) 40 MG capsule    Return in about 2 months (around 02/09/2024) for Mood.    Duaine German, MD

## 2023-12-12 NOTE — Assessment & Plan Note (Addendum)
 Discussed options.  Will go ahead and increase fluoxetine  to 40 mg.f/u in 8 weeks will see if helpful with reducing apathy and improving motivation and reducing depressive sxs.  She is still staying active and exercising.  Started working directly with EAP but did file to start.  Looking to find an external therapist who is in network for her.

## 2024-02-09 ENCOUNTER — Encounter: Payer: Self-pay | Admitting: Family Medicine

## 2024-02-09 ENCOUNTER — Telehealth: Payer: Self-pay | Admitting: *Deleted

## 2024-02-09 ENCOUNTER — Ambulatory Visit (INDEPENDENT_AMBULATORY_CARE_PROVIDER_SITE_OTHER): Payer: 59 | Admitting: Family Medicine

## 2024-02-09 VITALS — BP 130/72 | HR 67 | Ht 67.0 in | Wt 133.0 lb

## 2024-02-09 DIAGNOSIS — F1090 Alcohol use, unspecified, uncomplicated: Secondary | ICD-10-CM | POA: Diagnosis not present

## 2024-02-09 DIAGNOSIS — F330 Major depressive disorder, recurrent, mild: Secondary | ICD-10-CM

## 2024-02-09 NOTE — Telephone Encounter (Signed)
 Routing to Dr. Linford Arnold as an Lorain Childes.

## 2024-02-09 NOTE — Assessment & Plan Note (Signed)
 PHQ-9 score of 4 and GAD-7 score of 3.  We discussed options.  She thinks she wants to really just wean off of the medication completely and see how she does without it.  She is just not convinced that it has been helpful.  She said she will consider trying something else but wants to see how she does first.  She thinks she still has some 20s at home and might be able to take those and split them so I did write out a taper for her.

## 2024-02-09 NOTE — Progress Notes (Unsigned)
   Established Patient Office Visit  Subjective  Patient ID: [name], female    DOB: 04/19/1951  Age: 73 y.o. MRN: [id number]  Chief Complaint  Patient presents with   Medical Management of Chronic Issues    mood    HPI  Follow-up mood-we increased fluoxetine to 40 mg about 8 weeks ago.  So she is here today for follow-up.  She says she just does not feel great on the 40 mg she just does not feel like herself.  She still does not feel motivated she still struggling by lunchtime is just hard to get up and do anything normally she enjoys exercising and she has not even really been able to do that.  She thinks she wants to just come off of the medication.  She had tried Lexapro previously but it caused hair loss.  Quit drinking alcohol completely in December and has remained abstinent.  {History (Optional):23778}  ROS    Objective:     BP 130/72   Pulse 67   Ht 5\' 7"  (1.702 m)   Wt 133 lb (60.3 kg)   SpO2 98%   BMI 20.83 kg/m  {Vitals History (Optional):23777}  Physical Exam Vitals and nursing note reviewed.  Constitutional:      Appearance: Normal appearance.  HENT:     Head: Normocephalic and atraumatic.  Eyes:     Conjunctiva/sclera: Conjunctivae normal.  Cardiovascular:     Rate and Rhythm: Normal rate and regular rhythm.  Pulmonary:     Effort: Pulmonary effort is normal.     Breath sounds: Normal breath sounds.  Skin:    General: Skin is warm and dry.  Neurological:     Mental Status: She is alert.  Psychiatric:        Mood and Affect: Mood normal.      No results found for any visits on 02/09/24.  {Labs (Optional):23779}  The ASCVD Risk score (Arnett DK, et al., 2019) failed to calculate for the following reasons:   The valid HDL cholesterol range is 20 to 100 mg/dL    Assessment & Plan:   Problem List Items Addressed This Visit       Other   MDD (major depressive disorder), recurrent episode, mild (HCC) - Primary   PHQ-9 score of 4  and GAD-7 score of 3.  We discussed options.  She thinks she wants to really just wean off of the medication completely and see how she does without it.  She is just not convinced that it has been helpful.  She said she will consider trying something else but wants to see how she does first.  She thinks she still has some 20s at home and might be able to take those and split them so I did write out a taper for her.       No follow-ups on file.    Nani Gasser, MD

## 2024-02-09 NOTE — Patient Instructions (Signed)
 Fluoxetine 20mg  daily for 10 days Then 10 mg daily for 10 days and then can stop.

## 2024-02-09 NOTE — Telephone Encounter (Signed)
 Copied from CRM [id number]. Topic: Clinical - Medication Question >> Feb 09, 2024  4:31 PM Dennison Nancy wrote: Reason for CRM: patient had an appointment with Nani Gasser , patient called back to let Dr. Linford Arnold know patient has enough of the  FLUoxetine (PROZAC) 40 MG capsule , so Dr. Linford Arnold don't have to call in a refill  Patient callback number [phone]

## 2024-02-10 ENCOUNTER — Other Ambulatory Visit: Payer: Self-pay | Admitting: Family Medicine

## 2024-02-10 NOTE — Assessment & Plan Note (Signed)
 Been abstinent since December which is fantastic just encouraged her to continue with that and try to continue to work on healthy lifestyle changes.

## 2024-05-10 ENCOUNTER — Ambulatory Visit (INDEPENDENT_AMBULATORY_CARE_PROVIDER_SITE_OTHER): Admitting: Family Medicine

## 2024-05-10 ENCOUNTER — Encounter: Payer: Self-pay | Admitting: Family Medicine

## 2024-05-10 VITALS — BP 124/76 | HR 83 | Ht 67.0 in

## 2024-05-10 DIAGNOSIS — R42 Dizziness and giddiness: Secondary | ICD-10-CM

## 2024-05-10 DIAGNOSIS — F1090 Alcohol use, unspecified, uncomplicated: Secondary | ICD-10-CM | POA: Diagnosis not present

## 2024-05-10 DIAGNOSIS — F330 Major depressive disorder, recurrent, mild: Secondary | ICD-10-CM

## 2024-05-10 DIAGNOSIS — E785 Hyperlipidemia, unspecified: Secondary | ICD-10-CM | POA: Diagnosis not present

## 2024-05-10 MED ORDER — BUPROPION HCL ER (XL) 150 MG PO TB24: 150.0000 mg | ORAL_TABLET | Freq: Every day | ORAL | 0 refills | Status: DC

## 2024-05-10 NOTE — Progress Notes (Signed)
 Pt reports that she hasn't felt right. She was with family recently and reports that she felt off. She was getting out of a chair and got lightheaded. This lasted a few seconds and went away. She stated that  she would like to get some labs done. She mentioned maybe having MS?   She stopped taking the Prozac .

## 2024-05-10 NOTE — Assessment & Plan Note (Signed)
 Wellbutrin  would be a good option we did discuss the medication we will start 150 mg extended release daily and then follow-up in about 6 weeks so that we can make adjustments or continue the regimen.  Will check vitals at that visit as well to make sure that blood pressure is doing good.

## 2024-05-10 NOTE — Assessment & Plan Note (Signed)
 Encouraged her to work on cessation again she did great for most 6 months.  And if her going to start Wellbutrin  she will need to discontinue alcohol use again she feels confident that she can do so and that is her plan.

## 2024-05-10 NOTE — Assessment & Plan Note (Signed)
 Feeling would like to take a look at her lipids since it has been over a year and make sure that we are on track.

## 2024-05-10 NOTE — Progress Notes (Addendum)
 Established Patient Office Visit  Subjective  Patient ID: [name], female    DOB: 06/05/1951  Age: 73 y.o. MRN: [id number]  Chief Complaint  Patient presents with   Medical Management of Chronic Issues    HPI  Pt reports that she hasn't felt right. She was with family recently and reports that she felt off. She was getting out of a chair and got lightheaded. This lasted a few seconds and went away. She stated that no recent chest pain or palpitations.  She would like to get some labs done.  She did go see her chiropractor who had also recommended some labs.  She brought the paper in with her.  She has not had lab work in quite some time she mentioned maybe having MS?  She had noticed a gush occasional dizziness with standing up and position change when she was on the Prozac  but it has gotten better since she has been off the Prozac  but still noticing it occasionally.    She stopped taking the Prozac .  She has tapered herself down as we discussed and actually did really well with the taper.  They more recently she did start drinking alcohol again after about 5-1/2 months she found out something about a friend that was very upsetting and started drinking again.  She is interested in getting back on medication and would like to discuss Wellbutrin  as an option she has done some research and reading on it and wonders if that could be something she could take instead of the fluoxetine .  She has gained weight and so was concerned and does not want to take anything that could cause weight gain.   PHQ-9 score of 7 and GAD-7 score of 3.   ROS    Objective:     BP 124/76   Pulse 83   Ht 5' 7 (1.702 m)   SpO2 100%   BMI 20.83 kg/m    Physical Exam Vitals and nursing note reviewed.  Constitutional:      Appearance: Normal appearance.  HENT:     Head: Normocephalic and atraumatic.     Right Ear: Tympanic membrane, ear canal and external ear normal.     Left Ear: Tympanic  membrane, ear canal and external ear normal.  Eyes:     Conjunctiva/sclera: Conjunctivae normal.  Cardiovascular:     Rate and Rhythm: Normal rate and regular rhythm.  Pulmonary:     Effort: Pulmonary effort is normal.     Breath sounds: Normal breath sounds.  Skin:    General: Skin is warm and dry.  Neurological:     Mental Status: She is alert.  Psychiatric:        Mood and Affect: Mood normal.      No results found for any visits on 05/10/24.    The ASCVD Risk score (Arnett DK, et al., 2019) failed to calculate for the following reasons:   The valid HDL cholesterol range is 20 to 100 mg/dL    Assessment & Plan:   Problem List Items Addressed This Visit       Other   MDD (major depressive disorder), recurrent episode, mild (HCC) - Primary   Wellbutrin  would be a good option we did discuss the medication we will start 150 mg extended release daily and then follow-up in about 6 weeks so that we can make adjustments or continue the regimen.  Will check vitals at that visit as well to make sure that blood pressure  is doing good.      Relevant Medications   buPROPion  (WELLBUTRIN  XL) 150 MG 24 hr tablet   Hyperlipidemia LDL goal <130   Feeling would like to take a look at her lipids since it has been over a year and make sure that we are on track.      Relevant Medications   buPROPion  (WELLBUTRIN  XL) 150 MG 24 hr tablet   Other Relevant Orders   CMP14+EGFR   CBC with Differential/Platelet   VITAMIN D  25 Hydroxy (Vit-D Deficiency, Fractures)   Thyroid Panel With TSH   Apolipoprotein A-1   Lipoprotein A (LPA)   Apolipoprotein B   CRP High sensitivity   Sedimentation rate   Alcohol use   Encouraged her to work on cessation again she did great for most 6 months.  And if her going to start Wellbutrin  she will need to discontinue alcohol use again she feels confident that she can do so and that is her plan.      Other Visit Diagnoses       Lightheaded        Relevant Medications   buPROPion  (WELLBUTRIN  XL) 150 MG 24 hr tablet   Other Relevant Orders   CMP14+EGFR   CBC with Differential/Platelet   VITAMIN D  25 Hydroxy (Vit-D Deficiency, Fractures)   Thyroid Panel With TSH   Apolipoprotein A-1   Lipoprotein A (LPA)   Apolipoprotein B   CRP High sensitivity   Sedimentation rate      Lightheadedness-we did do orthostatics today which were normal.  We also discussed making sure that she is hydrating well.  She reports that someday she does not do great with that so really trying to up her water intake.  No effusion in the ears based on exam today.  Consider BPPV as well.  Blood pressure otherwise looks great today  Return in about 6 weeks (around 06/21/2024) for Mood, New start medication.    Dorothyann Byars, MD

## 2024-05-11 ENCOUNTER — Ambulatory Visit: Payer: Self-pay | Admitting: Family Medicine

## 2024-05-11 LAB — SEDIMENTATION RATE: Sed Rate: 3 mm/h (ref 0–40)

## 2024-05-11 LAB — THYROID PANEL WITH TSH
Free Thyroxine Index: 2.1 (ref 1.2–4.9)
T3 Uptake Ratio: 33 % (ref 24–39)
T4, Total: 6.3 ug/dL (ref 4.5–12.0)
TSH: 2.25 u[IU]/mL (ref 0.450–4.500)

## 2024-05-11 LAB — CMP14+EGFR
ALT: 19 IU/L (ref 0–32)
AST: 20 IU/L (ref 0–40)
Albumin: 4.5 g/dL (ref 3.8–4.8)
Alkaline Phosphatase: 67 IU/L (ref 44–121)
BUN/Creatinine Ratio: 29 — ABNORMAL HIGH (ref 12–28)
BUN: 23 mg/dL (ref 8–27)
Bilirubin Total: 0.5 mg/dL (ref 0.0–1.2)
CO2: 20 mmol/L (ref 20–29)
Calcium: 9.4 mg/dL (ref 8.7–10.3)
Chloride: 100 mmol/L (ref 96–106)
Creatinine, Ser: 0.79 mg/dL (ref 0.57–1.00)
Globulin, Total: 2.3 g/dL (ref 1.5–4.5)
Glucose: 98 mg/dL (ref 70–99)
Potassium: 4.6 mmol/L (ref 3.5–5.2)
Sodium: 139 mmol/L (ref 134–144)
Total Protein: 6.8 g/dL (ref 6.0–8.5)
eGFR: 78 mL/min/1.73 (ref >59)

## 2024-05-11 LAB — CBC WITH DIFFERENTIAL/PLATELET
Basophils Absolute: 0 x10E3/uL (ref 0.0–0.2)
Basos: 1 %
EOS (ABSOLUTE): 0.3 x10E3/uL (ref 0.0–0.4)
Eos: 5 %
Hematocrit: 49.3 % — ABNORMAL HIGH (ref 34.0–46.6)
Hemoglobin: 15.9 g/dL (ref 11.1–15.9)
Immature Grans (Abs): 0 x10E3/uL (ref 0.0–0.1)
Immature Granulocytes: 0 %
Lymphocytes Absolute: 1.4 x10E3/uL (ref 0.7–3.1)
Lymphs: 27 %
MCH: 30.9 pg (ref 26.6–33.0)
MCHC: 32.3 g/dL (ref 31.5–35.7)
MCV: 96 fL (ref 79–97)
Monocytes Absolute: 0.5 x10E3/uL (ref 0.1–0.9)
Monocytes: 10 %
Neutrophils Absolute: 2.9 x10E3/uL (ref 1.4–7.0)
Neutrophils: 57 %
Platelets: 321 x10E3/uL (ref 150–450)
RBC: 5.14 x10E6/uL (ref 3.77–5.28)
RDW: 13.2 % (ref 11.7–15.4)
WBC: 5.1 x10E3/uL (ref 3.4–10.8)

## 2024-05-11 LAB — APOLIPOPROTEIN A-1: Apolipoprotein A-1: 228 mg/dL — ABNORMAL HIGH (ref 116–209)

## 2024-05-11 LAB — HIGH SENSITIVITY CRP: CRP, High Sensitivity: 0.34 mg/L (ref 0.00–3.00)

## 2024-05-11 LAB — LIPOPROTEIN A (LPA): Lipoprotein (a): 43.8 nmol/L (ref <75.0)

## 2024-05-11 LAB — APOLIPOPROTEIN B: Apolipoprotein B: 125 mg/dL — ABNORMAL HIGH (ref <90)

## 2024-05-11 LAB — VITAMIN D 25 HYDROXY (VIT D DEFICIENCY, FRACTURES): Vit D, 25-Hydroxy: 36.9 ng/mL (ref 30.0–100.0)

## 2024-05-11 NOTE — Progress Notes (Signed)
 Hi [name], metabolic panel overall looks good.  Blood count looks good as well.  MND is a little on the lower side we like it above 40 yours is 36.  So recommended taking 25 mcg daily if you are not already taking a vitamin D  supplement.  Thyroid  panel is normal.  CRP which is an inflammatory cardiovascular risk factor is normal which is great.  Your apolipoprotein A1 is elevated, as well as your apolipoprotein B.  This does indicate potential increased cardiovascular risk.  1 option we could look at is something called a cardiac calcium score.  This is where it takes a low-dose CAT scan of your coronary arteries to see if you may already have plaque burden.  This helps us  better risk stratify, if you need to be on more preventative treatment for cardiovascular disease.  If that something you are interested in let me know.  I know you may want to go over these findings with your chiropractor.  Apoprotein a is normal.

## 2024-06-11 ENCOUNTER — Encounter: Payer: Self-pay | Admitting: Family Medicine

## 2024-06-11 DIAGNOSIS — R2689 Other abnormalities of gait and mobility: Secondary | ICD-10-CM

## 2024-06-11 NOTE — Telephone Encounter (Signed)
 Pended referral

## 2024-06-21 ENCOUNTER — Ambulatory Visit (INDEPENDENT_AMBULATORY_CARE_PROVIDER_SITE_OTHER): Admitting: Family Medicine

## 2024-06-21 VITALS — BP 134/70 | HR 71 | Ht 67.0 in

## 2024-06-21 DIAGNOSIS — E785 Hyperlipidemia, unspecified: Secondary | ICD-10-CM | POA: Diagnosis not present

## 2024-06-21 DIAGNOSIS — T887XXA Unspecified adverse effect of drug or medicament, initial encounter: Secondary | ICD-10-CM | POA: Diagnosis not present

## 2024-06-21 DIAGNOSIS — F330 Major depressive disorder, recurrent, mild: Secondary | ICD-10-CM

## 2024-06-21 NOTE — Progress Notes (Signed)
   Established Patient Office Visit  Subjective  Patient ID: [name], female    DOB: 02/13/1951  Age: 73 y.o. MRN: [id number]  Chief Complaint  Patient presents with   mood    HPI She wasn't able to take the Wellbutrin . It caused her to feel off balance and almost fell.  She stopped it about 1.5 week ago.  Since coming off the medication she has felt more back to herself.  She has found a local therapist dn has upcoming appt.   She found a therapist that she is going to start seeing.    Had mammogram done and brought report today.   She did start prebiotic recently to help with gut issues.  She is taking vitamin D  supplement 10,000 units daily.  I done some additional labs back in July which did show an elevated apolipoprotein B in the high range.  Trelegy appointment is coming up in early September.  She still works out on the treadmill for 20 minutes 6 days a week.    ROS    Objective:     BP 134/70   Pulse 71   Ht 5' 7 (1.702 m)   SpO2 99%   BMI 20.83 kg/m    Physical Exam Vitals and nursing note reviewed.  Constitutional:      Appearance: Normal appearance.  HENT:     Head: Normocephalic and atraumatic.  Eyes:     Conjunctiva/sclera: Conjunctivae normal.  Cardiovascular:     Rate and Rhythm: Normal rate and regular rhythm.  Pulmonary:     Effort: Pulmonary effort is normal.     Breath sounds: Normal breath sounds.  Skin:    General: Skin is warm and dry.  Neurological:     Mental Status: She is alert.  Psychiatric:        Mood and Affect: Mood normal.      No results found for any visits on 06/21/24.    The ASCVD Risk score (Arnett DK, et al., 2019) failed to calculate for the following reasons:   The valid HDL cholesterol range is 20 to 100 mg/dL    Assessment & Plan:   Problem List Items Addressed This Visit       Other   MDD (major depressive disorder), recurrent episode, mild (HCC)   Unfortunately she did not tolerate the  Wellbutrin  so we did add that to her intolerance list.  She also has not tolerated Lexapro  in the past which caused hair loss.      Hyperlipidemia LDL goal <130   I think she would benefit from having a calcium CT score for better restratification she is okay with moving forward with that.      Relevant Orders   CT CARDIAC SCORING (SELF PAY ONLY)   Other Visit Diagnoses       Medication side effect    -  Primary      Medication side effect-added to intolerance list.   No follow-ups on file.    Dorothyann Byars, MD

## 2024-06-21 NOTE — Patient Instructions (Signed)
 Plan to recheck Vitamin D  in 2-3 months.

## 2024-06-21 NOTE — Progress Notes (Signed)
 SABRA

## 2024-06-26 ENCOUNTER — Encounter: Payer: Self-pay | Admitting: Family Medicine

## 2024-06-26 NOTE — Assessment & Plan Note (Signed)
 Unfortunately she did not tolerate the Wellbutrin  so we did add that to her intolerance list.  She also has not tolerated Lexapro  in the past which caused hair loss.

## 2024-06-26 NOTE — Assessment & Plan Note (Signed)
 I think she would benefit from having a calcium CT score for better restratification she is okay with moving forward with that.

## 2024-06-28 ENCOUNTER — Ambulatory Visit: Payer: Self-pay | Admitting: Family Medicine

## 2024-06-28 ENCOUNTER — Ambulatory Visit (INDEPENDENT_AMBULATORY_CARE_PROVIDER_SITE_OTHER): Payer: Self-pay

## 2024-06-28 DIAGNOSIS — E785 Hyperlipidemia, unspecified: Secondary | ICD-10-CM

## 2024-06-28 NOTE — Progress Notes (Signed)
 HI [name], great news!!   Coronary calcium score of 0. This suggests low risk for future cardiac events.

## 2024-08-27 ENCOUNTER — Encounter: Payer: Self-pay | Admitting: Family Medicine

## 2024-08-28 MED ORDER — BUPROPION HCL 75 MG PO TABS: ORAL_TABLET | ORAL | 2 refills | Status: AC

## 2024-08-28 NOTE — Addendum Note (Signed)
 Addended by: Destany Severns D on: 08/28/2024 08:33 PM   Modules accepted: Orders

## 2024-08-28 NOTE — Telephone Encounter (Signed)
 Meds ordered this encounter  Medications   buPROPion  (WELLBUTRIN ) 75 MG tablet    Sig: Take 1 tablet (75 mg total) by mouth 2 (two) times daily for 8 days, THEN 1 tablet (75 mg total) 3 (three) times daily for 24 days.    Dispense:  90 tablet    Refill:  2

## 2025-02-20 ENCOUNTER — Ambulatory Visit: Payer: 59 | Admitting: Family Medicine
